# Patient Record
Sex: Male | Born: 1940
Health system: Southern US, Community
[De-identification: ages and names within clinical notes are randomized; demographics above are authoritative.]

## PROBLEM LIST (undated history)

## (undated) DIAGNOSIS — D332 Benign neoplasm of brain, unspecified: Secondary | ICD-10-CM

---

## 2007-02-25 ENCOUNTER — Emergency Department (HOSPITAL_COMMUNITY): Admission: EM | Admit: 2007-02-25 | Discharge: 2007-02-25 | Payer: Self-pay | Admitting: Emergency Medicine

## 2007-03-02 ENCOUNTER — Encounter (HOSPITAL_COMMUNITY): Admission: RE | Admit: 2007-03-02 | Discharge: 2007-05-26 | Payer: Self-pay | Admitting: Emergency Medicine

## 2007-03-05 ENCOUNTER — Emergency Department (HOSPITAL_COMMUNITY): Admission: EM | Admit: 2007-03-05 | Discharge: 2007-03-05 | Payer: Self-pay | Admitting: Emergency Medicine

## 2008-10-16 ENCOUNTER — Encounter (INDEPENDENT_AMBULATORY_CARE_PROVIDER_SITE_OTHER): Payer: Self-pay | Admitting: *Deleted

## 2013-06-07 DIAGNOSIS — Z800 Family history of malignant neoplasm of digestive organs: Secondary | ICD-10-CM | POA: Diagnosis not present

## 2013-06-07 DIAGNOSIS — Z1211 Encounter for screening for malignant neoplasm of colon: Secondary | ICD-10-CM | POA: Diagnosis not present

## 2015-02-04 ENCOUNTER — Other Ambulatory Visit: Payer: Self-pay | Admitting: Family Medicine

## 2015-02-04 ENCOUNTER — Ambulatory Visit
Admission: RE | Admit: 2015-02-04 | Discharge: 2015-02-04 | Disposition: A | Discharge status: Home / Self Care | Payer: Medicare Other | Source: Ambulatory Visit | Attending: Family Medicine | Admitting: Family Medicine

## 2015-02-04 DIAGNOSIS — M25472 Effusion, left ankle: Secondary | ICD-10-CM | POA: Diagnosis not present

## 2015-02-04 DIAGNOSIS — Z23 Encounter for immunization: Secondary | ICD-10-CM | POA: Diagnosis not present

## 2015-02-04 DIAGNOSIS — L290 Pruritus ani: Secondary | ICD-10-CM | POA: Diagnosis not present

## 2015-02-04 DIAGNOSIS — M19072 Primary osteoarthritis, left ankle and foot: Secondary | ICD-10-CM | POA: Diagnosis not present

## 2015-11-21 ENCOUNTER — Ambulatory Visit
Admission: RE | Admit: 2015-11-21 | Discharge: 2015-11-21 | Disposition: A | Discharge status: Home / Self Care | Payer: Medicare Other | Source: Ambulatory Visit | Attending: Family Medicine | Admitting: Family Medicine

## 2015-11-21 ENCOUNTER — Other Ambulatory Visit: Payer: Self-pay | Admitting: Family Medicine

## 2015-11-21 DIAGNOSIS — S298XXA Other specified injuries of thorax, initial encounter: Secondary | ICD-10-CM

## 2015-11-21 DIAGNOSIS — S2241XA Multiple fractures of ribs, right side, initial encounter for closed fracture: Secondary | ICD-10-CM | POA: Diagnosis not present

## 2015-11-21 DIAGNOSIS — S20211A Contusion of right front wall of thorax, initial encounter: Secondary | ICD-10-CM

## 2015-11-21 DIAGNOSIS — S2242XA Multiple fractures of ribs, left side, initial encounter for closed fracture: Secondary | ICD-10-CM | POA: Diagnosis not present

## 2015-11-21 DIAGNOSIS — R0781 Pleurodynia: Secondary | ICD-10-CM | POA: Diagnosis not present

## 2017-10-07 DIAGNOSIS — R42 Dizziness and giddiness: Secondary | ICD-10-CM | POA: Diagnosis not present

## 2017-10-07 DIAGNOSIS — H9201 Otalgia, right ear: Secondary | ICD-10-CM | POA: Diagnosis not present

## 2017-10-07 DIAGNOSIS — R2689 Other abnormalities of gait and mobility: Secondary | ICD-10-CM | POA: Diagnosis not present

## 2017-10-18 ENCOUNTER — Encounter (HOSPITAL_COMMUNITY): Payer: Self-pay | Admitting: Emergency Medicine

## 2017-10-18 ENCOUNTER — Emergency Department (HOSPITAL_COMMUNITY): Payer: Medicare Other

## 2017-10-18 ENCOUNTER — Emergency Department (HOSPITAL_COMMUNITY)
Admission: EM | Admit: 2017-10-18 | Discharge: 2017-10-18 | Disposition: A | Discharge status: Home / Self Care | Payer: Medicare Other | Attending: Emergency Medicine | Admitting: Emergency Medicine

## 2017-10-18 ENCOUNTER — Other Ambulatory Visit: Payer: Self-pay

## 2017-10-18 DIAGNOSIS — H829 Vertiginous syndromes in diseases classified elsewhere, unspecified ear: Secondary | ICD-10-CM | POA: Insufficient documentation

## 2017-10-18 DIAGNOSIS — R42 Dizziness and giddiness: Secondary | ICD-10-CM | POA: Diagnosis not present

## 2017-10-18 DIAGNOSIS — D333 Benign neoplasm of cranial nerves: Secondary | ICD-10-CM | POA: Diagnosis not present

## 2017-10-18 DIAGNOSIS — Z79899 Other long term (current) drug therapy: Secondary | ICD-10-CM | POA: Insufficient documentation

## 2017-10-18 DIAGNOSIS — R29810 Facial weakness: Secondary | ICD-10-CM | POA: Diagnosis not present

## 2017-10-18 LAB — DIFFERENTIAL
Basophils Absolute: 0 K/uL (ref 0.0–0.1)
Basophils Relative: 0 %
Eosinophils Absolute: 0.3 K/uL (ref 0.0–0.7)
Eosinophils Relative: 4 %
Lymphocytes Relative: 39 %
Lymphs Abs: 2.4 K/uL (ref 0.7–4.0)
Monocytes Absolute: 0.6 K/uL (ref 0.1–1.0)
Monocytes Relative: 10 %
Neutro Abs: 3 K/uL (ref 1.7–7.7)
Neutrophils Relative %: 47 %

## 2017-10-18 LAB — I-STAT CHEM 8, ED
BUN: 23 mg/dL — AB (ref 6–20)
CHLORIDE: 101 mmol/L (ref 101–111)
CREATININE: 0.9 mg/dL (ref 0.61–1.24)
Calcium, Ion: 1.2 mmol/L (ref 1.15–1.40)
Glucose, Bld: 94 mg/dL (ref 65–99)
HCT: 42 % (ref 39.0–52.0)
Hemoglobin: 14.3 g/dL (ref 13.0–17.0)
POTASSIUM: 3.7 mmol/L (ref 3.5–5.1)
Sodium: 140 mmol/L (ref 135–145)
TCO2: 26 mmol/L (ref 22–32)

## 2017-10-18 LAB — COMPREHENSIVE METABOLIC PANEL WITH GFR
ALT: 24 U/L (ref 17–63)
AST: 28 U/L (ref 15–41)
Albumin: 4.3 g/dL (ref 3.5–5.0)
Alkaline Phosphatase: 46 U/L (ref 38–126)
Anion gap: 11 (ref 5–15)
BUN: 27 mg/dL — ABNORMAL HIGH (ref 6–20)
CO2: 25 mmol/L (ref 22–32)
Calcium: 9.5 mg/dL (ref 8.9–10.3)
Chloride: 104 mmol/L (ref 101–111)
Creatinine, Ser: 0.92 mg/dL (ref 0.61–1.24)
GFR calc Af Amer: >60 mL/min (ref >60)
GFR calc non Af Amer: >60 mL/min (ref >60)
Glucose, Bld: 95 mg/dL (ref 65–99)
Potassium: 3.9 mmol/L (ref 3.5–5.1)
Sodium: 140 mmol/L (ref 135–145)
Total Bilirubin: 1 mg/dL (ref 0.3–1.2)
Total Protein: 7.8 g/dL (ref 6.5–8.1)

## 2017-10-18 LAB — CBC
HEMATOCRIT: 43.5 % (ref 39.0–52.0)
Hemoglobin: 15.1 g/dL (ref 13.0–17.0)
MCH: 32.7 pg (ref 26.0–34.0)
MCHC: 34.7 g/dL (ref 30.0–36.0)
MCV: 94.2 fL (ref 78.0–100.0)
PLATELETS: 132 10*3/uL — AB (ref 150–400)
RBC: 4.62 MIL/uL (ref 4.22–5.81)
RDW: 12.8 % (ref 11.5–15.5)
WBC: 6.3 10*3/uL (ref 4.0–10.5)

## 2017-10-18 LAB — URINALYSIS, ROUTINE W REFLEX MICROSCOPIC
BILIRUBIN URINE: NEGATIVE
Glucose, UA: NEGATIVE mg/dL
HGB URINE DIPSTICK: NEGATIVE
KETONES UR: NEGATIVE mg/dL
Leukocytes, UA: NEGATIVE
NITRITE: NEGATIVE
PROTEIN: NEGATIVE mg/dL
Specific Gravity, Urine: 1.006 (ref 1.005–1.030)
pH: 6 (ref 5.0–8.0)

## 2017-10-18 LAB — RAPID URINE DRUG SCREEN, HOSP PERFORMED
Amphetamines: NOT DETECTED
Barbiturates: NOT DETECTED
Benzodiazepines: NOT DETECTED
Cocaine: NOT DETECTED
Opiates: NOT DETECTED
Tetrahydrocannabinol: NOT DETECTED

## 2017-10-18 LAB — PROTIME-INR
INR: 1.05
Prothrombin Time: 13.6 seconds (ref 11.4–15.2)

## 2017-10-18 LAB — I-STAT TROPONIN, ED: Troponin i, poc: 0.01 ng/mL (ref 0.00–0.08)

## 2017-10-18 LAB — ETHANOL

## 2017-10-18 LAB — APTT: aPTT: 26 s (ref 24–36)

## 2017-10-18 MED ORDER — METHYLPREDNISOLONE 4 MG PO TBPK: ORAL_TABLET | ORAL | 0 refills | Status: DC

## 2017-10-18 MED ORDER — ARTIFICIAL TEARS OPHTHALMIC OINT: TOPICAL_OINTMENT | OPHTHALMIC | 1 refills | Status: DC | PRN

## 2017-10-18 MED ORDER — ARTIFICIAL TEARS OPHTHALMIC OINT
TOPICAL_OINTMENT | OPHTHALMIC | Status: DC | PRN
  Administered 2017-10-18: 15:00:00 via OPHTHALMIC
  Filled 2017-10-18: qty 3.5

## 2017-10-18 MED ORDER — ARTIFICIAL TEARS OPHTHALMIC OINT: TOPICAL_OINTMENT | Freq: Once | OPHTHALMIC | Status: DC

## 2017-10-18 NOTE — ED Notes (Signed)
Patient transported to MR. 

## 2017-10-18 NOTE — ED Notes (Signed)
ED Provider at bedside. 

## 2017-10-18 NOTE — ED Triage Notes (Signed)
Patient experiencing right sided facial droop since this morning. Has had equilibrium problems for 1 month and was prescribed meclizine. MD has seen patient. VS stable.

## 2017-10-18 NOTE — ED Notes (Signed)
Patient in MR.

## 2017-10-18 NOTE — ED Provider Notes (Signed)
[facility] DEPT Provider Note  CSN: [id number] Arrival date & time: 10/18/17 0957  Chief Complaint(s) Facial Droop  HPI Walter Parrish is a 77 y.o. male with no pertinent past medical history who presents to the emergency department with 2 days of gradually worsening right facial droop.  Patient reports that he noted difficulty closing his eyes yesterday morning.  Noticed that he had difficulty with speech today.  Denies any other focal deficits.  Endorses several weeks of vertiginous symptoms diagnosed with peripheral vertigo by his PCP and given meclizine which is not helping.  He denies any recent fevers or infections.  Denies any headache, visual disturbance.  No chest pain or shortness of breath.  No nausea or vomiting.  No abdominal pain.  HPI  Past Medical History History reviewed. No pertinent past medical history. There are no active problems to display for this patient.  Home Medication(s) Prior to Admission medications   Medication Sig Start Date End Date Taking? Authorizing Provider  Cyanocobalamin (VITAMIN B 12 PO) Take 1 tablet by mouth daily.   Yes [provider]  meclizine (ANTIVERT) 25 MG tablet Take 25 mg by mouth daily.   Yes [provider]  artificial tears (LACRILUBE) OINT ophthalmic ointment Place into the right eye every 4 (four) hours as needed for dry eyes. 10/18/17   Fatima Blank, MD  methylPREDNISolone (MEDROL DOSEPAK) 4 MG TBPK tablet Use as directed on the package 10/18/17   Braeson Rupe, Grayce Sessions, MD                                                                                                                                    Past Surgical History History reviewed. No pertinent surgical history. Family History History reviewed. No pertinent family history.  Social History Social History   Tobacco Use  . Smoking status: Not on file  Substance Use Topics  . Alcohol use: Not on file  . Drug  use: Not on file   Allergies Patient has no known allergies.  Review of Systems Review of Systems All other systems are reviewed and are negative for acute change except as noted in the HPI  Physical Exam Vital Signs  I have reviewed the triage vital signs BP (!) 166/99   Pulse 67   Temp 98.2 F (36.8 C) (Oral)   Resp (!) 24   SpO2 100%   Physical Exam  Constitutional: He is oriented to person, place, and time. He appears well-developed and well-nourished. No distress.  HENT:  Head: Normocephalic and atraumatic.  Nose: Nose normal.  Eyes: Pupils are equal, round, and reactive to light. Conjunctivae and EOM are normal. Right eye exhibits no discharge. Left eye exhibits no discharge. No scleral icterus.  Neck: Normal range of motion. Neck supple.  Cardiovascular: Normal rate and regular rhythm. Exam reveals no gallop and no friction rub.  No murmur heard. Pulmonary/Chest: Effort normal and breath  sounds normal. No stridor. No respiratory distress. He has no rales.  Abdominal: Soft. He exhibits no distension. There is no tenderness.  Musculoskeletal: He exhibits no edema or tenderness.  Neurological: He is alert and oriented to person, place, and time.  Mental Status:  Alert and oriented to person, place, and time.  Attention and concentration normal.  Speech clear.  Recent memory is intact  Cranial Nerves:  II Visual Fields: Intact to confrontation. Visual fields intact. III, IV, VI: Pupils equal and reactive to light and near. Full eye movement with unilateral nystagmus  V Facial Sensation: Normal. No weakness of masticatory muscles  VII: Right facial droop involving the forehead VIII:  Auditory Acuity: Grossly normal  IX/X: The uvula is midline; the palate elevates symmetrically  XI: Normal sternocleidomastoid and trapezius strength  XII: The tongue is midline. No atrophy or fasciculations.   Motor System: Muscle Strength: 5/5 and symmetric in the upper and lower  extremities. No pronation or drift.  Muscle Tone: Tone and muscle bulk are normal in the upper and lower extremities.   Reflexes: DTRs: 1+ and symmetrical in all four extremities. No Clonus Coordination: Intact finger-to-nose, heel-to-shin. No tremor.  Sensation: Intact to light touch, and pinprick. Positive Romberg test.  Gait: Routine gait normal; ataxic tandem gait.  HINTS Plus: Nystagmus: Unilateral nystagmus with leftward fast beat on left gaze Head impulse: Abnormal with head impulse towards the left Skew: Abnormal left eye skew Hearing: mildly decreased on right     Skin: Skin is warm and dry. No rash noted. He is not diaphoretic. No erythema.  Psychiatric: He has a normal mood and affect.  Vitals reviewed.   ED Results and Treatments Labs (all labs ordered are listed, but only abnormal results are displayed) Labs Reviewed  CBC - Abnormal; Notable for the following components:      Result Value   Platelets 132 (*)    All other components within normal limits  COMPREHENSIVE METABOLIC PANEL - Abnormal; Notable for the following components:   BUN 27 (*)    All other components within normal limits  URINALYSIS, ROUTINE W REFLEX MICROSCOPIC - Abnormal; Notable for the following components:   Color, Urine STRAW (*)    All other components within normal limits  I-STAT CHEM 8, ED - Abnormal; Notable for the following components:   BUN 23 (*)    All other components within normal limits  ETHANOL  PROTIME-INR  APTT  DIFFERENTIAL  RAPID URINE DRUG SCREEN, HOSP PERFORMED  I-STAT TROPONIN, ED                                                                                                                         EKG  EKG Interpretation  Date/Time:  Tuesday Oct 18 2017 10:52:00 EDT Ventricular Rate:  57 PR Interval:    QRS Duration: 123 QT Interval:  490 QTC Calculation: 478 R Axis:   51 Text Interpretation:  Sinus rhythm Right bundle branch block NO STEMI  No old tracing  to compare Confirmed by Addison Lank [phone]) on 10/18/2017 11:40:05 AM      Radiology Ct Head Wo Contrast  Result Date: 10/18/2017 CLINICAL DATA:  Right facial droop beginning this morning. EXAM: CT HEAD WITHOUT CONTRAST TECHNIQUE: Contiguous axial images were obtained from the base of the skull through the vertex without intravenous contrast. COMPARISON:  None. FINDINGS: Brain: No evidence of acute infarction, hemorrhage, hydrocephalus, extra-axial collection or mass lesion/mass effect. Vascular: No hyperdense vessel or unexpected calcification. Skull: Intact Sinuses/Orbits: Mucosal thickening in the sphenoid sinuses is worse on the left. Mild, scattered ethmoid air cell disease also noted. Other: None. IMPRESSION: No acute abnormality. Left worse than right sphenoid sinus mucosal thickening. Mild scattered ethmoid air cell disease also noted. Electronically Signed   By: Inge Rise M.D.   On: 10/18/2017 11:40   Mr Brain Wo Contrast  Result Date: 10/18/2017 CLINICAL DATA:  Right-sided facial droop since this morning. Equilibrium problems for 1 month EXAM: MRI HEAD WITHOUT CONTRAST TECHNIQUE: Multiplanar, multiecho pulse sequences of the brain and surrounding structures were obtained without intravenous contrast. COMPARISON:  Head CT from earlier today FINDINGS: Brain: Mass in the right internal auditory canal measuring 7 x 4 mm, usually a schwannoma, parent nerve uncertain with this presentation. No history of malignancy per the chart. No infarct, hemorrhage, hydrocephalus, or collection. There is mild generalized cerebral volume loss and mild presumed chronic small vessel ischemic change in the cerebral white matter. Vascular: Major flow voids are preserved. Skull and upper cervical spine: No evidence of marrow lesion Sinuses/Orbits: Mild patchy mucosal thickening in the paranasal sinuses. IMPRESSION: 7 x 4 mm mass at the right internal auditory canal, usually a schwannoma. Recommend postcontrast  brain MR with IAC protocol. Electronically Signed   By: Monte Fantasia M.D.   On: 10/18/2017 13:04   Pertinent labs & imaging results that were available during my care of the patient were reviewed by me and considered in my medical decision making (see chart for details).  Medications Ordered in ED Medications  artificial tears (LACRILUBE) ophthalmic ointment (has no administration in time range)                                                                                                                                    Procedures Procedures  (including critical care time)  Medical Decision Making / ED Course I have reviewed the nursing notes for this encounter and the patient's prior records (if available in EHR or on provided paperwork).    Presentation is consistent with Bell's palsy.  Doubt CVA.  Given inconsistent HINTS exam concerning for central process, stroke work-up was obtained and revealed a right acoustic canal schwannoma.  Rest of the work-up was reassuring.  Case discussed with Dr. Christella Noa from neurosurgery.  Will follow up with the patient in clinic.  Patient provided with artificial tears and Medrol Dosepak for inflammation.  Recommended no  driving until evaluated and cleared by neurosurgery.  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 Discharge instructions discussed at great length. The patient was given strict return precautions who verbalized understanding of the instructions. No further questions at time of discharge.    ED Discharge Orders        Ordered    methylPREDNISolone (MEDROL DOSEPAK) 4 MG TBPK tablet     10/18/17 1450    artificial tears (LACRILUBE) OINT ophthalmic ointment  Every 4 hours PRN      10/18/17 1450       Follow Up: [place] [phone] Follow up This office can become your primary doctor.  Ashok Pall, MD [address] [place] [address] [phone]  Call  For close follow up to assess for schannoma of the right ear     This chart was dictated using voice recognition software.  Despite best efforts to proofread,  errors can occur which can change the documentation meaning.   Fatima Blank, MD 10/18/17 [phone]

## 2017-10-18 NOTE — ED Notes (Signed)
MR at bedside.

## 2017-10-18 NOTE — ED Notes (Signed)
Patient aware we need a urine sample. Urinal at bedside. °

## 2017-10-18 NOTE — ED Notes (Signed)
Patient transported to radiology

## 2017-10-18 NOTE — ED Notes (Signed)
Patient aware he is going to MR. Patient states he is not claustrophobic.

## 2017-10-25 DIAGNOSIS — G510 Bell's palsy: Secondary | ICD-10-CM | POA: Diagnosis not present

## 2017-10-25 DIAGNOSIS — I10 Essential (primary) hypertension: Secondary | ICD-10-CM | POA: Diagnosis not present

## 2017-10-25 DIAGNOSIS — D333 Benign neoplasm of cranial nerves: Secondary | ICD-10-CM | POA: Diagnosis not present

## 2017-11-08 DIAGNOSIS — D333 Benign neoplasm of cranial nerves: Secondary | ICD-10-CM | POA: Diagnosis not present

## 2017-11-08 DIAGNOSIS — H9191 Unspecified hearing loss, right ear: Secondary | ICD-10-CM | POA: Diagnosis not present

## 2017-11-11 DIAGNOSIS — H9311 Tinnitus, right ear: Secondary | ICD-10-CM | POA: Diagnosis not present

## 2017-11-11 DIAGNOSIS — H838X3 Other specified diseases of inner ear, bilateral: Secondary | ICD-10-CM | POA: Diagnosis not present

## 2017-11-11 DIAGNOSIS — H9041 Sensorineural hearing loss, unilateral, right ear, with unrestricted hearing on the contralateral side: Secondary | ICD-10-CM | POA: Diagnosis not present

## 2017-11-11 DIAGNOSIS — D333 Benign neoplasm of cranial nerves: Secondary | ICD-10-CM | POA: Diagnosis not present

## 2017-11-11 DIAGNOSIS — R42 Dizziness and giddiness: Secondary | ICD-10-CM | POA: Diagnosis not present

## 2017-12-06 DIAGNOSIS — D333 Benign neoplasm of cranial nerves: Secondary | ICD-10-CM | POA: Diagnosis not present

## 2017-12-06 DIAGNOSIS — H8120 Vestibular neuronitis, unspecified ear: Secondary | ICD-10-CM | POA: Diagnosis not present

## 2017-12-06 DIAGNOSIS — G510 Bell's palsy: Secondary | ICD-10-CM | POA: Diagnosis not present

## 2017-12-06 DIAGNOSIS — H9190 Unspecified hearing loss, unspecified ear: Secondary | ICD-10-CM | POA: Diagnosis not present

## 2017-12-06 DIAGNOSIS — G9389 Other specified disorders of brain: Secondary | ICD-10-CM | POA: Diagnosis not present

## 2018-01-24 DIAGNOSIS — Z7189 Other specified counseling: Secondary | ICD-10-CM | POA: Diagnosis not present

## 2018-01-24 DIAGNOSIS — Z298 Encounter for other specified prophylactic measures: Secondary | ICD-10-CM | POA: Diagnosis not present

## 2018-05-01 DIAGNOSIS — D333 Benign neoplasm of cranial nerves: Secondary | ICD-10-CM | POA: Diagnosis not present

## 2018-05-01 DIAGNOSIS — Z510 Encounter for antineoplastic radiation therapy: Secondary | ICD-10-CM | POA: Diagnosis not present

## 2018-05-02 DIAGNOSIS — D333 Benign neoplasm of cranial nerves: Secondary | ICD-10-CM | POA: Diagnosis not present

## 2018-05-02 DIAGNOSIS — Z510 Encounter for antineoplastic radiation therapy: Secondary | ICD-10-CM | POA: Diagnosis not present

## 2018-05-03 DIAGNOSIS — Z510 Encounter for antineoplastic radiation therapy: Secondary | ICD-10-CM | POA: Diagnosis not present

## 2018-05-03 DIAGNOSIS — D333 Benign neoplasm of cranial nerves: Secondary | ICD-10-CM | POA: Diagnosis not present

## 2018-05-04 DIAGNOSIS — Z510 Encounter for antineoplastic radiation therapy: Secondary | ICD-10-CM | POA: Diagnosis not present

## 2018-05-04 DIAGNOSIS — D333 Benign neoplasm of cranial nerves: Secondary | ICD-10-CM | POA: Diagnosis not present

## 2018-05-05 DIAGNOSIS — Z510 Encounter for antineoplastic radiation therapy: Secondary | ICD-10-CM | POA: Diagnosis not present

## 2018-05-05 DIAGNOSIS — D333 Benign neoplasm of cranial nerves: Secondary | ICD-10-CM | POA: Diagnosis not present

## 2018-09-07 IMAGING — MR MR HEAD W/O CM
8 of 10 series · 37 of 48 positions shown · non-contrast
Comparison: Head CT from earlier today

CLINICAL DATA: Right-sided facial droop since this morning.
Equilibrium problems for 1 month

EXAM:
MRI HEAD WITHOUT CONTRAST
TECHNIQUE: Multiplanar, multiecho pulse sequences of the brain and surrounding
structures were obtained without intravenous contrast.

[Series 3: DWI · axial · 3.0mm · 1.09mm/px · z∈[-70,+82]mm · 8 of 104 slices shown (1 of 4)]
[im 1/104]
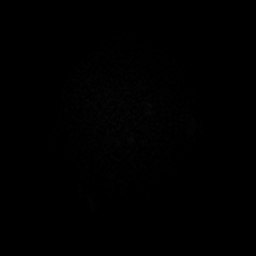
[im 12/104]
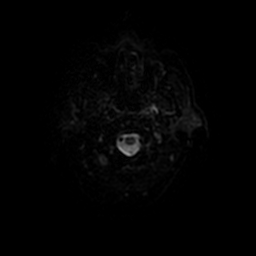
[im 35/104]
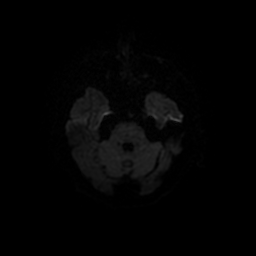
[im 46/104]
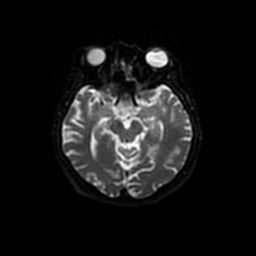
[im 58/104]
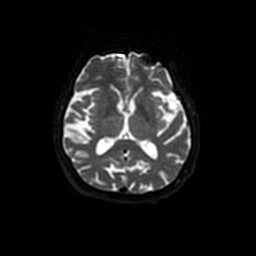
[im 69/104]
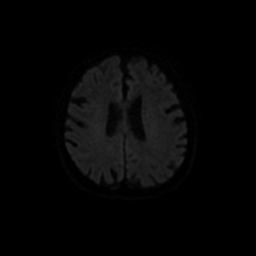
[im 92/104]
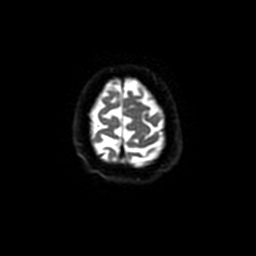
[im 104/104]
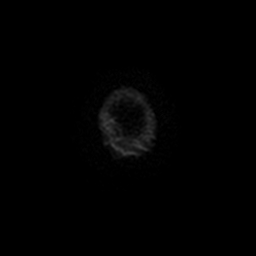

[Series 4: T1 · sagittal · 5.0mm · 0.47mm/px · 2 of 24 slices shown]
[im 1/24]
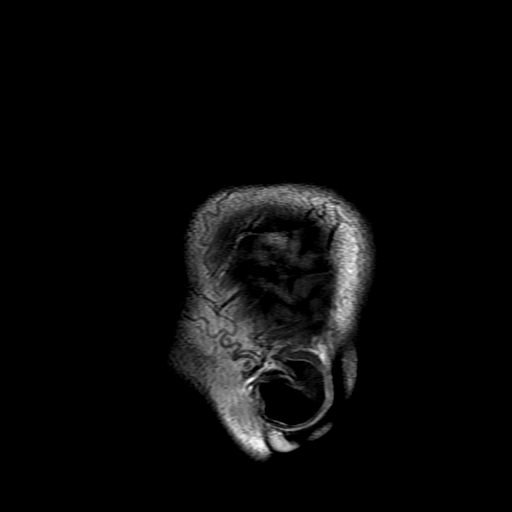
[im 24/24]
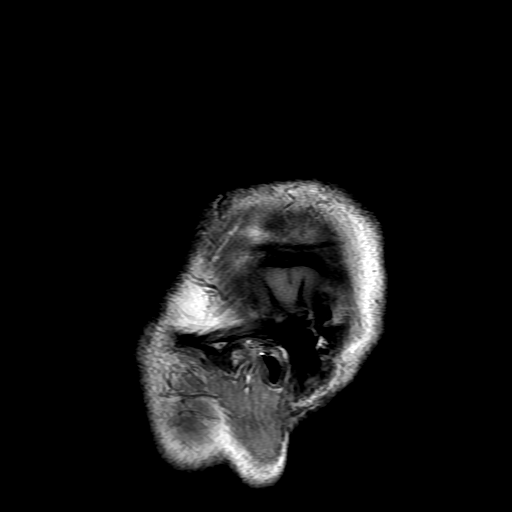

[Series 5: DWI · coronal · 5.0mm · 1.09mm/px · 8 of 76 slices shown (2 of 4)]
[im 1/76]
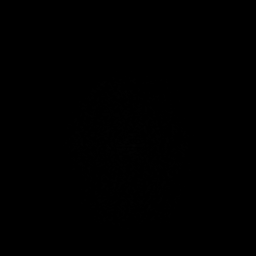
[im 11/76]
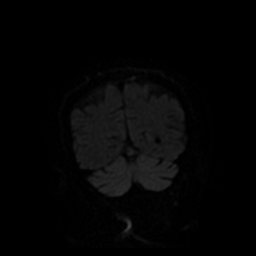
[im 22/76]
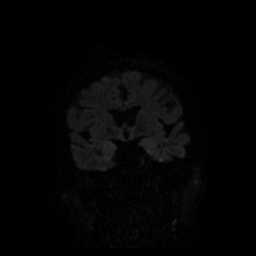
[im 33/76]
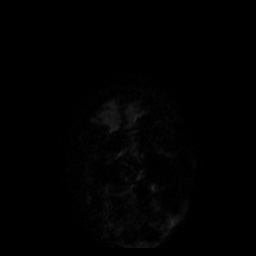
[im 43/76]
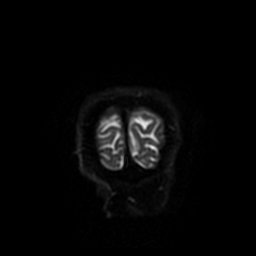
[im 54/76]
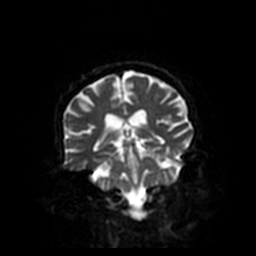
[im 65/76]
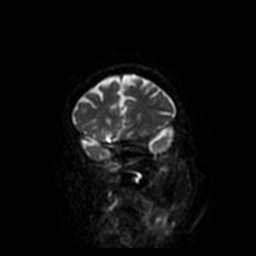
[im 76/76]
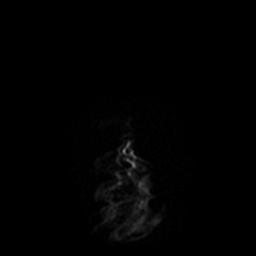

[Series 6: T2 · axial · 5.0mm · 0.43mm/px · z∈[-73,+81]mm · 3 of 24 slices shown (1 of 2)]
[im 1/24]
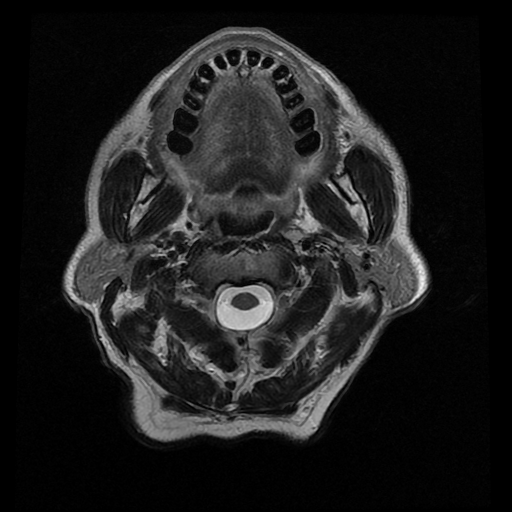
[im 12/24]
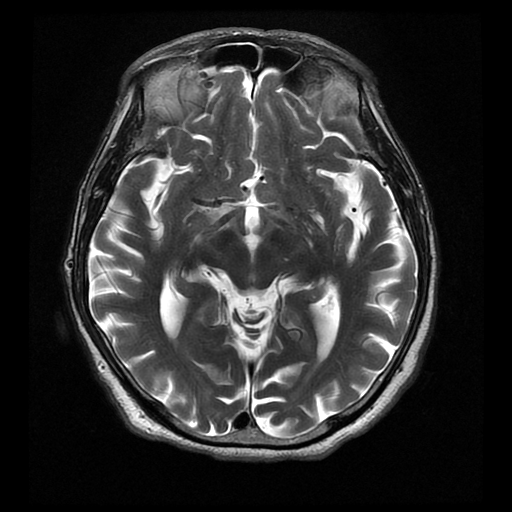
[im 24/24]
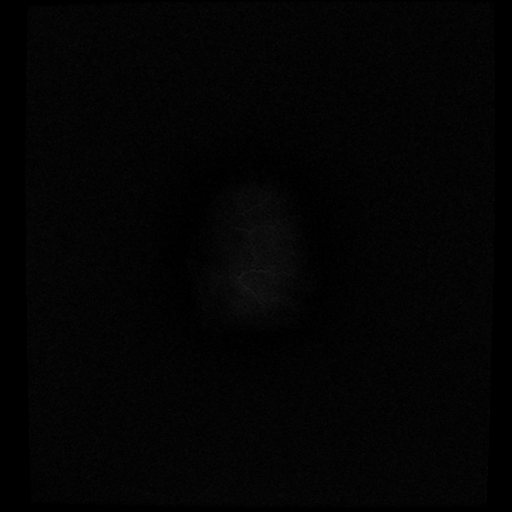

[Series 7: FLAIR · axial · 3.0mm · 0.43mm/px · z∈[-77,+85]mm · 3 of 29 slices shown]
[im 1/29]
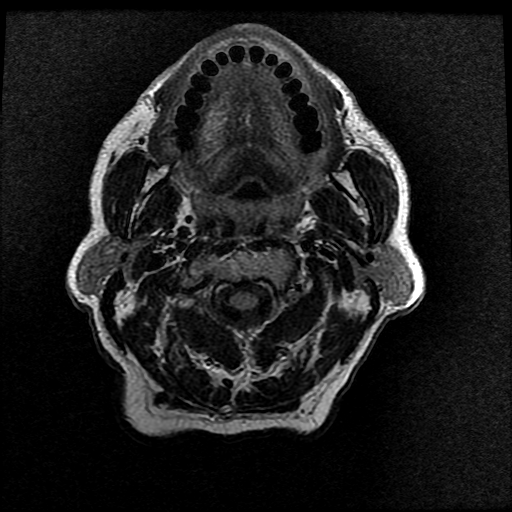
[im 15/29]
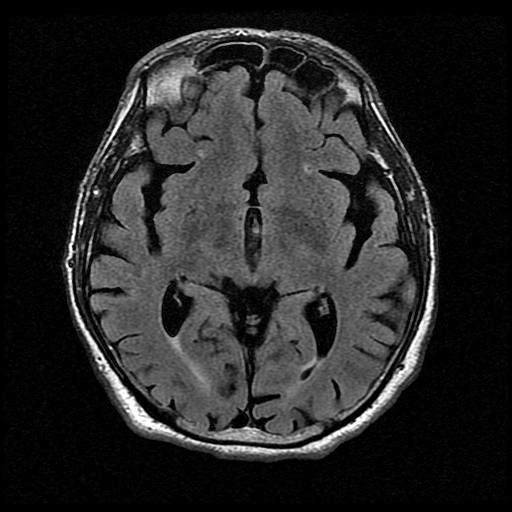
[im 29/29]
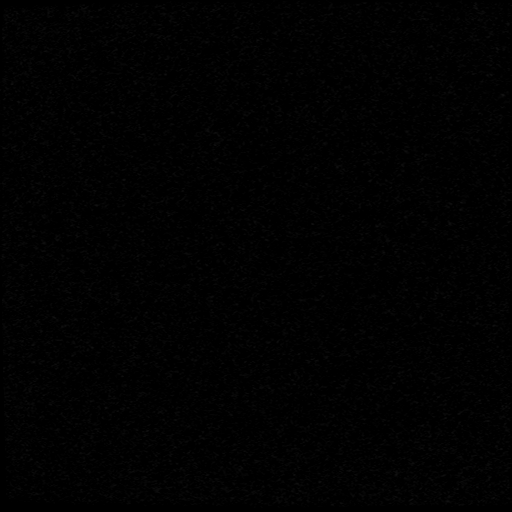

[Series 10: T2 · coronal · 5.0mm · 0.45mm/px · 3 of 27 slices shown (2 of 2)]
[im 1/27]
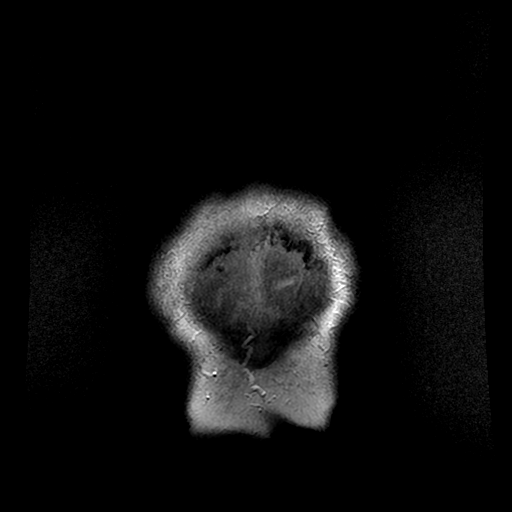
[im 14/27]
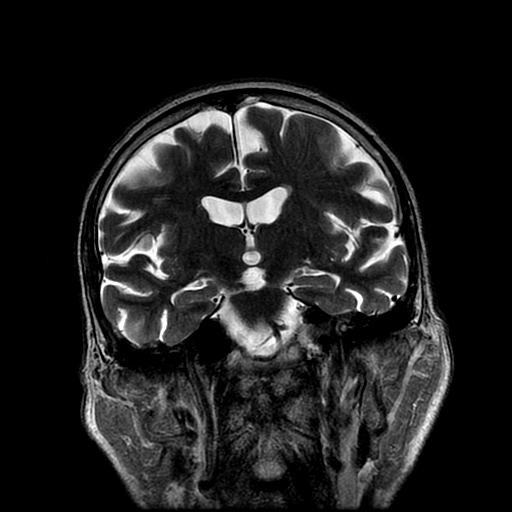
[im 27/27]
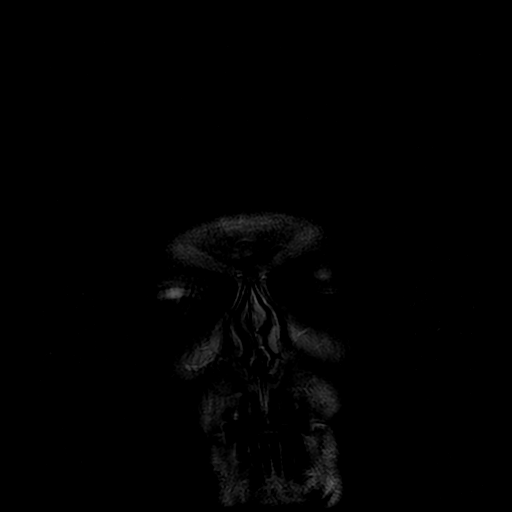

[Series 300: DWI · axial · 3.0mm · 1.09mm/px · z∈[-70,+82]mm · 6 of 52 slices shown (3 of 4)]
[im 1/52]
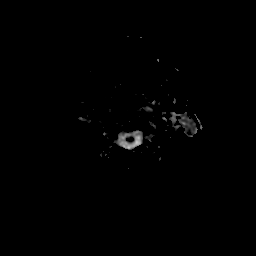
[im 11/52]
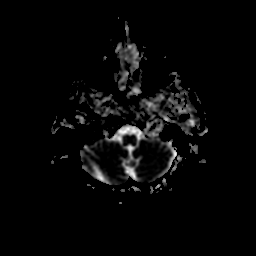
[im 21/52]
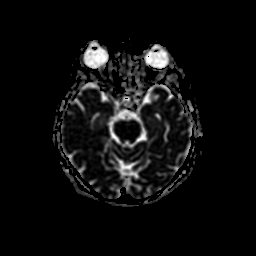
[im 31/52]
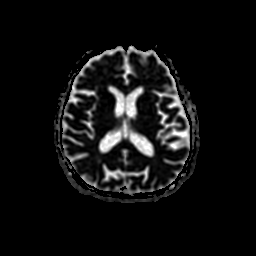
[im 41/52]
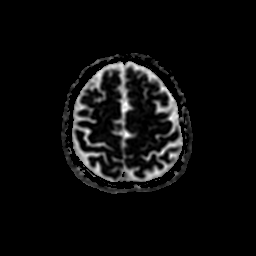
[im 52/52]
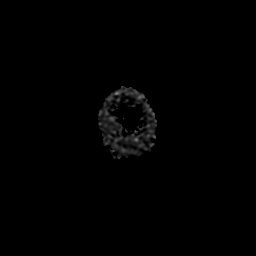

[Series 500: DWI · coronal · 5.0mm · 1.09mm/px · 4 of 37 slices shown (4 of 4)]
[im 1/37]
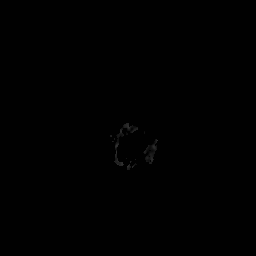
[im 13/37]
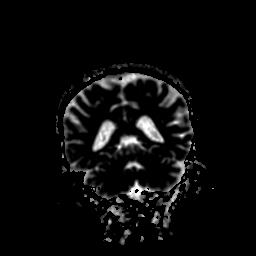
[im 25/37]
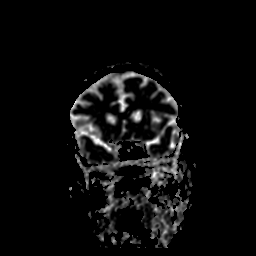
[im 37/37]
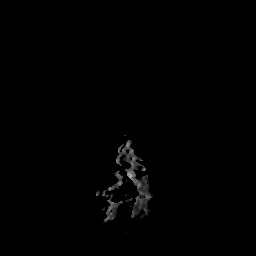

[37 of 48 positions shown; findings below may reference images not displayed]

FINDINGS: Brain: Mass in the right internal auditory canal measuring 7 x 4 mm,
usually a schwannoma, parent nerve uncertain with this presentation.
No history of malignancy per the chart.

No infarct, hemorrhage, hydrocephalus, or collection. There is mild
generalized cerebral volume loss and mild presumed chronic small
vessel ischemic change in the cerebral white matter.

Vascular: Major flow voids are preserved.

Skull and upper cervical spine: No evidence of marrow lesion

Sinuses/Orbits: Mild patchy mucosal thickening in the paranasal
sinuses.
IMPRESSION: 7 x 4 mm mass at the right internal auditory canal, usually a
schwannoma. Recommend postcontrast brain MR with IAC protocol.

## 2019-04-14 ENCOUNTER — Encounter (HOSPITAL_COMMUNITY): Payer: Self-pay | Admitting: Physician Assistant

## 2019-04-14 ENCOUNTER — Emergency Department (HOSPITAL_COMMUNITY)
Admission: EM | Admit: 2019-04-14 | Discharge: 2019-04-14 | Disposition: A | Discharge status: Home / Self Care | Payer: Medicare Other | Attending: Emergency Medicine | Admitting: Emergency Medicine

## 2019-04-14 ENCOUNTER — Other Ambulatory Visit: Payer: Self-pay

## 2019-04-14 DIAGNOSIS — Z23 Encounter for immunization: Secondary | ICD-10-CM | POA: Diagnosis not present

## 2019-04-14 DIAGNOSIS — S61212A Laceration without foreign body of right middle finger without damage to nail, initial encounter: Secondary | ICD-10-CM | POA: Diagnosis not present

## 2019-04-14 DIAGNOSIS — Z79899 Other long term (current) drug therapy: Secondary | ICD-10-CM | POA: Diagnosis not present

## 2019-04-14 DIAGNOSIS — Y9301 Activity, walking, marching and hiking: Secondary | ICD-10-CM | POA: Diagnosis not present

## 2019-04-14 DIAGNOSIS — Y929 Unspecified place or not applicable: Secondary | ICD-10-CM | POA: Diagnosis not present

## 2019-04-14 DIAGNOSIS — Y999 Unspecified external cause status: Secondary | ICD-10-CM | POA: Insufficient documentation

## 2019-04-14 DIAGNOSIS — W1830XA Fall on same level, unspecified, initial encounter: Secondary | ICD-10-CM | POA: Insufficient documentation

## 2019-04-14 DIAGNOSIS — W19XXXA Unspecified fall, initial encounter: Secondary | ICD-10-CM

## 2019-04-14 HISTORY — DX: Benign neoplasm of brain, unspecified: D33.2

## 2019-04-14 MED ORDER — CEPHALEXIN 500 MG PO CAPS: 500.0000 mg | ORAL_CAPSULE | Freq: Four times a day (QID) | ORAL | 0 refills | Status: DC

## 2019-04-14 MED ORDER — LIDOCAINE HCL (PF) 1 % IJ SOLN
5.0000 mL | Freq: Once | INTRAMUSCULAR | Status: AC
  Administered 2019-04-14: 5 mL
  Filled 2019-04-14: qty 5

## 2019-04-14 MED ORDER — TETANUS-DIPHTH-ACELL PERTUSSIS 5-2.5-18.5 LF-MCG/0.5 IM SUSP
0.5000 mL | Freq: Once | INTRAMUSCULAR | Status: AC
  Administered 2019-04-14: 0.5 mL via INTRAMUSCULAR
  Filled 2019-04-14: qty 0.5

## 2019-04-14 MED ORDER — BUPIVACAINE HCL (PF) 0.5 % IJ SOLN
5.0000 mL | Freq: Once | INTRAMUSCULAR | Status: AC
  Administered 2019-04-14: 19:00:00 5 mL
  Filled 2019-04-14: qty 10

## 2019-04-14 MED ORDER — CEPHALEXIN 250 MG PO CAPS
500.0000 mg | ORAL_CAPSULE | Freq: Once | ORAL | Status: AC
  Administered 2019-04-14: 20:00:00 500 mg via ORAL
  Filled 2019-04-14: qty 2

## 2019-04-14 NOTE — ED Triage Notes (Signed)
Pt fell while hiking cutting his right ring finger. Denies loc or hitting head.

## 2019-04-14 NOTE — Discharge Instructions (Signed)

## 2019-04-14 NOTE — ED Notes (Signed)
Called xray to let them know pt is now willing to have xray.

## 2019-04-14 NOTE — ED Notes (Signed)
Patient verbalizes understanding of discharge instructions. Opportunity for questioning and answers were provided. Armband removed by staff, pt discharged from ED.  

## 2019-04-14 NOTE — ED Provider Notes (Signed)
[facility] EMERGENCY DEPARTMENT Provider Note   CSN: [id number] Arrival date & time: 04/14/19  1642     History   Chief Complaint Chief Complaint  Patient presents with   Laceration    right ring finger    HPI Walter Parrish is a 78 y.o. male with no significant past medical history who presents today for evaluation after a fall.  He states that shortly prior to arrival he was hiking outside when he had a mechanical, nonsyncopal fall.  He states that he caught his right ring finger on the ground.  He is unsure when his last tetanus shot was.  He denies striking his home.  He denies any injuries other than his right ring finger.    HPI  Past Medical History:  Diagnosis Date   Benign brain tumor [facility])    Radation Dec 2019    There are no active problems to display for this patient.   History reviewed. No pertinent surgical history.      Home Medications    Prior to Admission medications   Medication Sig Start Date End Date Taking? Authorizing Provider  artificial tears (LACRILUBE) OINT ophthalmic ointment Place into the right eye every 4 (four) hours as needed for dry eyes. 10/18/17   Cardama, Grayce Sessions, MD  cephALEXin (KEFLEX) 500 MG capsule Take 1 capsule (500 mg total) by mouth 4 (four) times daily. 04/14/19   Lorin Glass, PA-C  Cyanocobalamin (VITAMIN B 12 PO) Take 1 tablet by mouth daily.    [provider]  meclizine (ANTIVERT) 25 MG tablet Take 25 mg by mouth daily.    [provider]  methylPREDNISolone (MEDROL DOSEPAK) 4 MG TBPK tablet Use as directed on the package 10/18/17   Cardama, Grayce Sessions, MD    Family History History reviewed. No pertinent family history.  Social History Social History   Tobacco Use   Smoking status: Never Smoker   Smokeless tobacco: Never Used  Substance Use Topics   Alcohol use: Yes    Alcohol/week: 1.0 standard drinks    Types: 1 Glasses of wine per week    Comment:  daily   Drug use: Never     Allergies   Patient has no known allergies.   Review of Systems Review of Systems  Constitutional: Negative for chills and fever.  Skin: Positive for wound.  Neurological: Negative for weakness, numbness and headaches.  All other systems reviewed and are negative.    Physical Exam Updated Vital Signs BP 110/81    Pulse 60    Ht 5\' 6"  (1.676 m)    Wt 65.8 kg    SpO2 100%    BMI 23.40 kg/m   Physical Exam Vitals signs and nursing note reviewed.  Constitutional:      General: He is not in acute distress. HENT:     Head: Normocephalic and atraumatic.  Neck:     Musculoskeletal: Normal range of motion and neck supple.  Musculoskeletal:     Comments: Full, pain free AROM of right hand and fingers.  No creptitis or deformity palpated RUE.   Skin:    Capillary Refill: Capillary refill takes less than 2 seconds.     Comments: 2cm V shaped laceration present over the right ring finger over the proximal phalange. No active bleeding.  Dirt present in the wound.   Neurological:     Mental Status: He is alert and oriented to person, place, and time.     Sensory:  No sensory deficit (Sensation intact to fingers on right hand to light touch).      ED Treatments / Results  Labs (all labs ordered are listed, but only abnormal results are displayed) Labs Reviewed - No data to display  EKG None  Radiology No results found.  Procedures .Marland KitchenLaceration Repair  Date/Time: 04/14/2019 10:49 PM Performed by: Lorin Glass, PA-C Authorized by: Lorin Glass, PA-C   Consent:    Consent obtained:  Verbal   Consent given by:  Patient   Risks discussed:  Infection, need for additional repair, poor cosmetic result, pain, retained foreign body, tendon damage, vascular damage, poor wound healing and nerve damage (Refused x-ray: increased risk of open fracture that may get infected, long term pain or abnormalities. )   Alternatives discussed:  No  treatment and referral (Alternative wound closures) Anesthesia (see MAR for exact dosages):    Anesthesia method:  Nerve block   Block location:  Right ring finger   Block needle gauge:  25 G   Block anesthetic: 50/50 mix lidocaine 1% and bupivacaine 0.5% both with out epi.    Block injection procedure:  Anatomic landmarks identified, anatomic landmarks palpated, introduced needle, negative aspiration for blood and incremental injection   Block outcome:  Anesthesia achieved Laceration details:    Location:  Finger   Finger location:  R ring finger   Length (cm):  2 Repair type:    Repair type:  Intermediate Pre-procedure details:    Preparation:  Patient was prepped and draped in usual sterile fashion (Patient refused x-ray) Exploration:    Hemostasis achieved with:  Direct pressure   Wound exploration: wound explored through full range of motion and entire depth of wound probed and visualized     Wound extent: foreign bodies/material     Wound extent: no tendon damage noted     Foreign bodies/material:  Dirt   Contaminated: yes   Treatment:    Area cleansed with:  Shur-Clens and Betadine   Amount of cleaning:  Extensive   Irrigation method:  Pressure wash   Visualized foreign bodies/material removed: yes   Skin repair:    Repair method:  Sutures   Suture size:  5-0   Wound skin closure material used: Vycryl rapide per patient request.   Suture technique:  Simple interrupted   Number of sutures:  3 Approximation:    Approximation:  Close Post-procedure details:    Dressing:  Non-adherent dressing, sterile dressing and bulky dressing   Patient tolerance of procedure:  Tolerated well, no immediate complications   (including critical care time)  Medications Ordered in ED Medications  lidocaine (PF) (XYLOCAINE) 1 % injection 5 mL (5 mLs Infiltration Given by Other 04/14/19 1900)  bupivacaine (MARCAINE) 0.5 % injection 5 mL (5 mLs Infiltration Given by Other 04/14/19 1900)    Tdap (BOOSTRIX) injection 0.5 mL (0.5 mLs Intramuscular Given 04/14/19 1859)  cephALEXin (KEFLEX) capsule 500 mg (500 mg Oral Given 04/14/19 1956)     Initial Impression / Assessment and Plan / ED Course  I have reviewed the triage vital signs and the nursing notes.  Pertinent labs & imaging results that were available during my care of the patient were reviewed by me and considered in my medical decision making (see chart for details).       Patient presents today for evaluation of a laceration of his right ring finger after a fall.  He denies any other injuries.  Discussed the indications for x-rays including possible  fracture, which if missed can result in an appropriate treatment of a open fracture, significant long-term complications including, but not limited to, missed fracture, joint stiffness, missed foreign body, or loss of function.  He states his understanding and still refuses x-ray.  Laceration was repaired, please see procedure note.  Tdap was updated.  Patient will be given a short course of Keflex to prevent infection given the contaminated nature of the wound.  Wound was viewed in a blood free field and the base of the wound was visualized without evidence of retained debris or foreign body.  This patient was seen as a shared visit with Dr. Reather Converse.   Return precautions were discussed with patient who states their understanding.  At the time of discharge patient denied any unaddressed complaints or concerns.  Patient is agreeable for discharge home.   Final Clinical Impressions(s) / ED Diagnoses   Final diagnoses:  Laceration of right middle finger without foreign body without damage to nail, initial encounter  Fall, initial encounter    ED Discharge Orders         Ordered    cephALEXin (KEFLEX) 500 MG capsule  4 times daily     04/14/19 1950           Ollen Gross 04/14/19 2254    Elnora Morrison, MD 04/14/19 [phone]

## 2019-04-14 NOTE — ED Notes (Signed)
Dr Reather Converse saw pt and said he did not have to have an xray.

## 2019-04-16 DIAGNOSIS — Z9889 Other specified postprocedural states: Secondary | ICD-10-CM | POA: Diagnosis not present

## 2019-04-16 DIAGNOSIS — Z923 Personal history of irradiation: Secondary | ICD-10-CM | POA: Diagnosis not present

## 2019-04-16 DIAGNOSIS — D333 Benign neoplasm of cranial nerves: Secondary | ICD-10-CM | POA: Diagnosis not present

## 2019-08-02 ENCOUNTER — Ambulatory Visit: Payer: Medicare Other | Attending: Family

## 2019-08-02 DIAGNOSIS — Z23 Encounter for immunization: Secondary | ICD-10-CM

## 2019-08-02 NOTE — Progress Notes (Signed)
   Covid-19 Vaccination Clinic  Name:  Walter Parrish    MRN: [id number] DOB: [birth year]  08/02/2019  [name] was observed post Covid-19 immunization for 15 minutes without incident. He was provided with Vaccine Information Sheet and instruction to access the V-Safe system.   [name] was instructed to call 911 with any severe reactions post vaccine: Marland Kitchen Difficulty breathing  . Swelling of face and throat  . A fast heartbeat  . A bad rash all over body  . Dizziness and weakness   Immunizations Administered    Name Date Dose VIS Date Route   Moderna COVID-19 Vaccine 08/02/2019  2:48 PM 0.5 mL 04/24/2019 Intramuscular   Manufacturer: Moderna   Lot: [id number]   [id number]

## 2019-09-04 ENCOUNTER — Ambulatory Visit: Payer: Medicare Other | Attending: Family

## 2019-09-04 DIAGNOSIS — Z23 Encounter for immunization: Secondary | ICD-10-CM

## 2019-09-04 NOTE — Progress Notes (Signed)
   Covid-19 Vaccination Clinic  Name:  Dvonta Caltrider    MRN: [id number] DOB: Nov 13, 1940  09/04/2019  Mr. Sutherland was observed post Covid-19 immunization for 15 minutes without incident. He was provided with Vaccine Information Sheet and instruction to access the V-Safe system.   Mr. Villena was instructed to call 911 with any severe reactions post vaccine: Marland Kitchen Difficulty breathing  . Swelling of face and throat  . A fast heartbeat  . A bad rash all over body  . Dizziness and weakness   Immunizations Administered    Name Date Dose VIS Date Route   Moderna COVID-19 Vaccine 09/04/2019  2:53 PM 0.5 mL 04/24/2019 Intramuscular   Manufacturer: Moderna   Lot: [id number]   [id number]

## 2020-10-14 DIAGNOSIS — L237 Allergic contact dermatitis due to plants, except food: Secondary | ICD-10-CM | POA: Diagnosis not present

## 2021-04-09 DIAGNOSIS — G510 Bell's palsy: Secondary | ICD-10-CM | POA: Diagnosis not present

## 2021-04-09 DIAGNOSIS — Z923 Personal history of irradiation: Secondary | ICD-10-CM | POA: Diagnosis not present

## 2021-04-09 DIAGNOSIS — D333 Benign neoplasm of cranial nerves: Secondary | ICD-10-CM | POA: Diagnosis not present

## 2021-04-29 DIAGNOSIS — U071 COVID-19: Secondary | ICD-10-CM | POA: Diagnosis not present

## 2021-04-29 DIAGNOSIS — R051 Acute cough: Secondary | ICD-10-CM | POA: Diagnosis not present

## 2022-01-17 DIAGNOSIS — L237 Allergic contact dermatitis due to plants, except food: Secondary | ICD-10-CM | POA: Diagnosis not present

## 2022-01-17 DIAGNOSIS — T7840XA Allergy, unspecified, initial encounter: Secondary | ICD-10-CM | POA: Diagnosis not present

## 2022-01-17 DIAGNOSIS — R21 Rash and other nonspecific skin eruption: Secondary | ICD-10-CM | POA: Diagnosis not present

## 2022-01-17 DIAGNOSIS — L509 Urticaria, unspecified: Secondary | ICD-10-CM | POA: Diagnosis not present

## 2022-03-27 ENCOUNTER — Encounter (HOSPITAL_COMMUNITY): Payer: Self-pay

## 2022-03-27 ENCOUNTER — Ambulatory Visit (HOSPITAL_COMMUNITY)
Admission: EM | Admit: 2022-03-27 | Discharge: 2022-03-27 | Disposition: A | Discharge status: Home / Self Care | Payer: Medicare HMO | Attending: Physician Assistant | Admitting: Physician Assistant

## 2022-03-27 DIAGNOSIS — S50812A Abrasion of left forearm, initial encounter: Secondary | ICD-10-CM

## 2022-03-27 DIAGNOSIS — S80212A Abrasion, left knee, initial encounter: Secondary | ICD-10-CM

## 2022-03-27 DIAGNOSIS — S80211A Abrasion, right knee, initial encounter: Secondary | ICD-10-CM | POA: Diagnosis not present

## 2022-03-27 DIAGNOSIS — S0181XA Laceration without foreign body of other part of head, initial encounter: Secondary | ICD-10-CM | POA: Diagnosis not present

## 2022-03-27 DIAGNOSIS — W19XXXA Unspecified fall, initial encounter: Secondary | ICD-10-CM

## 2022-03-27 DIAGNOSIS — T148XXA Other injury of unspecified body region, initial encounter: Secondary | ICD-10-CM

## 2022-03-27 MED ORDER — MUPIROCIN 2 % EX OINT: 1.0000 | TOPICAL_OINTMENT | Freq: Two times a day (BID) | CUTANEOUS | 2 refills | Status: DC

## 2022-03-27 NOTE — ED Triage Notes (Signed)
Pt fell today and injured chin, scraped both right and left leg, left elbow today.

## 2022-03-27 NOTE — Discharge Instructions (Signed)
We placed 3 sutures in your chin.  Please keep this area clean.  If you have any swelling, drainage, pain you need to be seen immediately to consider antibiotics.  We have cleaned your abrasions.  Keep them clean with soap and water and apply ointment twice daily.  If he is have any signs of infections need to be seen immediately.  If you develop any difficulty eating, chewing, swelling of your jaw, trouble walking, weakness, headache, dizziness, nausea, vomiting you need to go to the emergency room immediately.

## 2022-03-27 NOTE — ED Provider Notes (Addendum)
[place]    CSN: [id number] Arrival date & time: 03/27/22  1705      History   Chief Complaint Chief Complaint  Patient presents with   Fall    HPI Walter Parrish is a 81 y.o. male.   Patient presents today following a fall that occurred a few hours ago.  Reports [place] he tripped over a branch causing him to fall forward onto his knees and hit his chin.  He did clean the area with iodine and rinse this well.  He is confident that he not hit his head and denies any headache, dizziness, loss of consciousness, nausea, vomiting, amnesia surrounding event.  He does not take any blood thinning medications.  He is comfort that is tetanus is up-to-date as he fell in a similar episode several years ago and had it updated at that point (04/14/2019).  He denies any difficulty ambulating, knee pain, numbness or tingling in his legs.  Reports that his primary concern today is that the wounds and he believes that he might need stitches on the laceration under his chin.  He is able to open his mouth without difficulty.    Past Medical History:  Diagnosis Date   Benign brain tumor [facility])    Radation Dec 2019    There are no problems to display for this patient.   History reviewed. No pertinent surgical history.     Home Medications    Prior to Admission medications   Medication Sig Start Date End Date Taking? Authorizing Provider  mupirocin ointment (BACTROBAN) 2 % Apply 1 Application topically 2 (two) times daily. 03/27/22  Yes Treva Huyett, Derry Skill, PA-C  artificial tears (LACRILUBE) OINT ophthalmic ointment Place into the right eye every 4 (four) hours as needed for dry eyes. 10/18/17   Cardama, Grayce Sessions, MD  cephALEXin (KEFLEX) 500 MG capsule Take 1 capsule (500 mg total) by mouth 4 (four) times daily. 04/14/19   Lorin Glass, PA-C  Cyanocobalamin (VITAMIN B 12 PO) Take 1 tablet by mouth daily.    [provider]  meclizine  (ANTIVERT) 25 MG tablet Take 25 mg by mouth daily.    [provider]  methylPREDNISolone (MEDROL DOSEPAK) 4 MG TBPK tablet Use as directed on the package 10/18/17   Cardama, Grayce Sessions, MD    Family History History reviewed. No pertinent family history.  Social History Social History   Tobacco Use   Smoking status: Never   Smokeless tobacco: Never  Vaping Use   Vaping Use: Never used  Substance Use Topics   Alcohol use: Yes    Alcohol/week: 1.0 standard drink of alcohol    Types: 1 Glasses of wine per week    Comment: daily   Drug use: Never     Allergies   Patient has no known allergies.   Review of Systems Review of Systems  Constitutional:  Negative for activity change, appetite change, fatigue and fever.  Eyes:  Negative for visual disturbance.  Respiratory:  Negative for cough and shortness of breath.   Cardiovascular:  Negative for chest pain.  Gastrointestinal:  Negative for abdominal pain, diarrhea, nausea and vomiting.  Musculoskeletal:  Negative for arthralgias and myalgias.  Skin:  Positive for wound. Negative for color change.  Neurological:  Negative for dizziness, light-headedness and headaches.     Physical Exam Triage Vital Signs ED Triage Vitals  Enc Vitals Group     BP 03/27/22 1719 125/76  Pulse Rate 03/27/22 1719 77     Resp 03/27/22 1719 16     Temp 03/27/22 1719 99.1 F (37.3 C)     Temp Source 03/27/22 1719 Oral     SpO2 03/27/22 1719 98 %     Weight --      Height --      Head Circumference --      Peak Flow --      Pain Score 03/27/22 1718 0     Pain Loc --      Pain Edu? --      Excl. in [place]? --    No data found.  Updated Vital Signs BP 125/76 (BP Location: Right Arm)   Pulse 77   Temp 99.1 F (37.3 C) (Oral)   Resp 16   SpO2 98%   Visual Acuity Right Eye Distance:   Left Eye Distance:   Bilateral Distance:    Right Eye Near:   Left Eye Near:    Bilateral Near:     Physical Exam Vitals reviewed.   Constitutional:      General: He is awake.     Appearance: Normal appearance. He is well-developed. He is not ill-appearing.     Comments: Very pleasant male appears stated age in no acute distress sitting comfortably in exam room  HENT:     Head: Normocephalic. Laceration present.     Jaw: There is normal jaw occlusion. No tenderness, swelling, pain on movement or malocclusion.     Comments: 3 cm laceration noted inferior mental protuberance with surrounding ecchymosis.  No tenderness over jaw.  No deformity noted.    Right Ear: Tympanic membrane, ear canal and external ear normal. No hemotympanum.     Left Ear: Tympanic membrane, ear canal and external ear normal. No hemotympanum.     Nose: Nose normal.     Mouth/Throat:     Pharynx: Uvula midline. No oropharyngeal exudate or posterior oropharyngeal erythema.  Eyes:     Extraocular Movements: Extraocular movements intact.     Conjunctiva/sclera: Conjunctivae normal.     Pupils: Pupils are equal, round, and reactive to light.  Cardiovascular:     Rate and Rhythm: Normal rate and regular rhythm.     Heart sounds: Normal heart sounds, S1 normal and S2 normal. No murmur heard. Pulmonary:     Effort: Pulmonary effort is normal. No accessory muscle usage or respiratory distress.     Breath sounds: Normal breath sounds. No stridor. No wheezing, rhonchi or rales.     Comments: Clear to auscultation bilaterally Musculoskeletal:     Cervical back: Normal range of motion and neck supple.  Skin:    Findings: Abrasion and laceration present.     Comments: Multiple abrasions noted anterior knees bilaterally and left forearm without active bleeding.  3 cm laceration noted inferior mental protuberance.  Neurological:     General: No focal deficit present.     Mental Status: He is alert and oriented to person, place, and time.     Cranial Nerves: Cranial nerves 2-12 are intact.     Motor: Motor function is intact.     Coordination: Coordination  is intact.     Gait: Gait is intact.     Comments: Cranial nerves II through XII grossly intact.  Psychiatric:        Behavior: Behavior is cooperative.      UC Treatments / Results  Labs (all labs ordered are listed, but only abnormal results are displayed)  Labs Reviewed - No data to display  EKG   Radiology No results found.  Procedures Laceration Repair  Date/Time: 03/27/2022 6:32 PM  Performed by: Terrilee Croak, PA-C Authorized by: Terrilee Croak, PA-C   Consent:    Consent obtained:  Verbal   Consent given by:  Patient   Risks, benefits, and alternatives were discussed: yes     Risks discussed:  Infection, poor cosmetic result and poor wound healing   Alternatives discussed:  Referral and observation Universal protocol:    Procedure explained and questions answered to patient or proxy's satisfaction: yes     Patient identity confirmed:  Verbally with patient Anesthesia:    Anesthesia method:  Local infiltration   Local anesthetic:  Lidocaine 1% WITH epi Laceration details:    Location:  Face   Face location:  Chin   Length (cm):  3   Depth (mm):  5 Pre-procedure details:    Preparation:  Patient was prepped and draped in usual sterile fashion Exploration:    Limited defect created (wound extended): no     Hemostasis achieved with:  Direct pressure and epinephrine   Wound exploration: entire depth of wound visualized     Contaminated: no   Treatment:    Area cleansed with:  Chlorhexidine   Amount of cleaning:  Standard   Irrigation solution:  Sterile water   Irrigation volume:  20 mL   Irrigation method:  Syringe   Visualized foreign bodies/material removed: no     Debridement:  None   Undermining:  None   Scar revision: no   Skin repair:    Repair method:  Sutures   Suture size:  5-0   Suture material:  Prolene   Suture technique:  Simple interrupted   Number of sutures:  3 Approximation:    Approximation:  Close Repair type:    Repair type:   Simple Post-procedure details:    Dressing:  Open (no dressing)   Procedure completion:  Tolerated  (including critical care time)  Medications Ordered in UC Medications - No data to display  Initial Impression / Assessment and Plan / UC Course  I have reviewed the triage vital signs and the nursing notes.  Pertinent labs & imaging results that were available during my care of the patient were reviewed by me and considered in my medical decision making (see chart for details).     Patient is well-appearing with reassuring exam today.  He is confident that he did not hit his head and denies any concerning symptoms.  Discussed that we do not have advanced imaging in clinic if he develops any worsening symptoms he would need to go to the emergency room to which he expressed understanding.  Tetanus is up-to-date.  3 sutures placed in chin laceration which patient tolerated well.  See procedure note above.  Wounds were cleaned with chlorhexidine and wound cleaner then irrigated with sterile water in clinic prior to being redressed during visit.  Discussed that he will need to keep area clean with soap and water and Bactroban ointment was provided to prevent infection.  Encouraged him to follow-up closely with his primary care.  He will return in 7 to 10 days for suture removal.  Discussed signs/symptoms of infection that would warrant reevaluation to consider antibiotics.  Discussed alarm symptoms that warrant emergent evaluation.  Strict return precautions given.  Final Clinical Impressions(s) / UC Diagnoses   Final diagnoses:  Chin laceration, initial encounter  Skin abrasion  Fall,  initial encounter     Discharge Instructions      We placed 3 sutures in your chin.  Please keep this area clean.  If you have any swelling, drainage, pain you need to be seen immediately to consider antibiotics.  We have cleaned your abrasions.  Keep them clean with soap and water and apply ointment twice  daily.  If he is have any signs of infections need to be seen immediately.  If you develop any difficulty eating, chewing, swelling of your jaw, trouble walking, weakness, headache, dizziness, nausea, vomiting you need to go to the emergency room immediately.    ED Prescriptions     Medication Sig Dispense Auth. Provider   mupirocin ointment (BACTROBAN) 2 % Apply 1 Application topically 2 (two) times daily. 22 g Moustafa Mossa K, PA-C      PDMP not reviewed this encounter.   Terrilee Croak, PA-C 03/27/22 1832    Huzaifa Viney, Derry Skill, PA-C 03/27/22 1834

## 2022-04-05 ENCOUNTER — Ambulatory Visit (HOSPITAL_COMMUNITY)
Admission: RE | Admit: 2022-04-05 | Discharge: 2022-04-05 | Disposition: A | Discharge status: Home / Self Care | Payer: Medicare HMO | Source: Ambulatory Visit

## 2022-04-05 DIAGNOSIS — S0181XD Laceration without foreign body of other part of head, subsequent encounter: Secondary | ICD-10-CM

## 2022-04-05 NOTE — ED Triage Notes (Signed)
Pt got 3 sutures to chin area that is needing removed. Denies s/s of infection and area healing well.

## 2022-07-30 DIAGNOSIS — S61412A Laceration without foreign body of left hand, initial encounter: Secondary | ICD-10-CM | POA: Diagnosis not present

## 2022-08-12 DIAGNOSIS — S61412D Laceration without foreign body of left hand, subsequent encounter: Secondary | ICD-10-CM | POA: Diagnosis not present

## 2022-08-12 DIAGNOSIS — Z4802 Encounter for removal of sutures: Secondary | ICD-10-CM | POA: Diagnosis not present

## 2023-02-28 DIAGNOSIS — R6889 Other general symptoms and signs: Secondary | ICD-10-CM | POA: Diagnosis not present

## 2023-02-28 DIAGNOSIS — Z136 Encounter for screening for cardiovascular disorders: Secondary | ICD-10-CM | POA: Diagnosis not present

## 2023-02-28 DIAGNOSIS — R6884 Jaw pain: Secondary | ICD-10-CM | POA: Diagnosis not present

## 2023-04-18 ENCOUNTER — Encounter: Payer: Self-pay | Admitting: Cardiovascular Disease

## 2023-04-18 ENCOUNTER — Ambulatory Visit: Payer: Medicare HMO | Attending: Cardiovascular Disease | Admitting: Cardiovascular Disease

## 2023-04-18 VITALS — BP 112/70 | HR 62 | Ht 66.0 in | Wt 145.0 lb

## 2023-04-18 DIAGNOSIS — R6889 Other general symptoms and signs: Secondary | ICD-10-CM | POA: Diagnosis not present

## 2023-04-18 DIAGNOSIS — Z86018 Personal history of other benign neoplasm: Secondary | ICD-10-CM

## 2023-04-18 DIAGNOSIS — E7800 Pure hypercholesterolemia, unspecified: Secondary | ICD-10-CM | POA: Diagnosis not present

## 2023-04-18 DIAGNOSIS — R0602 Shortness of breath: Secondary | ICD-10-CM

## 2023-04-18 DIAGNOSIS — I4510 Unspecified right bundle-branch block: Secondary | ICD-10-CM

## 2023-04-18 NOTE — Progress Notes (Signed)
Cardiology Office Note:    Date:  04/18/2023   ID:  Walter Parrish, DOB December 28, 1940, MRN [id number]  PCP:  Patient, No Pcp Per   [facility] Providers Cardiologist:  None     Referring MD: Laurann Montana, MD   Chief Complaint  Patient presents with   Consult  Walter Parrish is a 82 y.o. male who is being seen today for the evaluation of decreased exercise tolerance at the request of Laurann Montana, MD.   History of Present Illness:    The patient, a lifelong jogger, presents with a complaint of a sudden and significant decrease in stamina over the past five to six years. The patient reports a gradual slowing of their mile time from seven minutes to around eleven minutes over the years, which they attributed to aging. However, in their mid-seventies, they noticed a drastic drop in their running speed, now taking approximately thirteen minutes to complete a mile. The patient also reports that they can no longer run more than two miles due to shortness of breath, which they describe as the primary factor limiting their exercise capacity. The patient denies any chest pain, dizziness, or knee issues during exercise. The patient also has a history of an acoustic neuroma, which caused dizziness and balance issues, significantly affecting their mobility during the summer of 2020 (improved after gamma knife therapy). However, the patient reports that the decrease in stamina was already noticeable before the onset of these symptoms. In addition to these issues, the patient reports occasional jaw pain, which makes eating difficult at times. However, they do not believe this is related to their exercise capacity as it does not affect them during running. The patient has a history of high cholesterol, which they previously managed with a cholesterol reduction program. They report a diet primarily consisting of fish, chicken, and nuts, and they maintain a lean and fit physique. The patient's family  history is notable for a father who suffered from severe arthritis and passed away at the age of 35 due to complications from immobility. The patient's mother lived to 38, and their siblings, who are in their sixties and seventies, are reportedly healthy.   He is originally from [place].  Past Medical History:  Diagnosis Date   Benign brain tumor [facility])    Radation Dec 2019    No past surgical history on file.  Current Medications: No outpatient medications have been marked as taking for the 04/18/23 encounter (Office Visit) with Thurmon Fair, MD.     Allergies:   Patient has no known allergies.   Social History   Socioeconomic History   Marital status: Single    Spouse name: Not on file   Number of children: Not on file   Years of education: Not on file   Highest education level: Not on file  Occupational History   Not on file  Tobacco Use   Smoking status: Never   Smokeless tobacco: Never  Vaping Use   Vaping status: Never Used  Substance and Sexual Activity   Alcohol use: Yes    Alcohol/week: 1.0 standard drink of alcohol    Types: 1 Glasses of wine per week    Comment: daily   Drug use: Never   Sexual activity: Not on file  Other Topics Concern   Not on file  Social History Narrative   Not on file   Social Determinants of Health   Financial Resource Strain: Not on file  Food Insecurity: Not on file  Transportation Needs: Not on file  Physical Activity: Not on file  Stress: Not on file  Social Connections: Not on file     Family History: The patient's family history is significant for the absence of cardiac illness.  His mother lived to age 60.  His father died from complications of rheumatoid arthritis at age 57.  He is the third oldest out of 8 siblings.  His 2 older brothers and all his younger siblings are in good health.  ROS:   Please see the history of present illness.     All other systems reviewed and are negative.  EKGs/Labs/Other Studies  Reviewed:    The following studies were reviewed today:  EKG Interpretation Date/Time:  Monday April 18 2023 14:58:56 EST Ventricular Rate:  62 PR Interval:  196 QRS Duration:  114 QT Interval:  446 QTC Calculation: 452 R Axis:   -37  Text Interpretation: Normal sinus rhythm Left axis deviation Incomplete right bundle branch block When compared with ECG of 18-Oct-2017 10:52,  axis has shifted left Confirmed by Dennard Vezina [phone]) on 04/18/2023 3:16:11 PM    Recent Labs: No results found for requested labs within last 365 days.  01/29/2023 hemoglobin 14.5, potassium 4.3, ALT 20, TSH 1.5. Recent Lipid Panel No results found for: "CHOL", "TRIG", "HDL", "CHOLHDL", "VLDL", "LDLCALC", "LDLDIRECT" 02/28/2023 cholesterol 243, HDL 68, LDL 163, triglycerides 70  Risk Assessment/Calculations:                Physical Exam:    VS:  BP 112/70 (BP Location: Left Arm, Patient Position: Sitting, Cuff Size: Normal)   Pulse 62   Ht 5\' 6"  (1.676 m)   Wt 145 lb (65.8 kg)   SpO2 98%   BMI 23.40 kg/m     Wt Readings from Last 3 Encounters:  04/18/23 145 lb (65.8 kg)  04/14/19 145 lb (65.8 kg)     GEN: Appears substantially younger than stated age, very lean and fit, well nourished, well developed in no acute distress HEENT: Normal NECK: No JVD; No carotid bruits LYMPHATICS: No lymphadenopathy CARDIAC: RRR, widely split second heart sound, no murmurs, rubs, gallops RESPIRATORY:  Clear to auscultation without rales, wheezing or rhonchi  ABDOMEN: Soft, non-tender, non-distended MUSCULOSKELETAL:  No edema; No deformity  SKIN: Warm and dry NEUROLOGIC:  Alert and oriented x 3 PSYCHIATRIC:  Normal affect   ASSESSMENT:    1. Decreased exercise tolerance   2. Shortness of breath   3. RBBB   4. Hypercholesterolemia    PLAN:    In order of problems listed above:  Decreased exercise tolerance/exertional dyspnea: Although he is much more physically active than the average  of-year-old, he has noticed a significant decline exercise tolerance, more than he expected from the previous gradual pattern with aging.  Will schedule him for a treadmill stress test and echocardiogram.  It is possible that his exertional dyspnea is an anginal equivalent.  If there is evidence of regional wall motion abnormality or decreased LVEF on echo or if there is abnormal ST segment depression during his treadmill stress test would recommend proceeding directly to invasive angiography.  Otherwise, if we cannot identify a cause for his exercise intolerance, we can consider a coronary CT angiogram. RBBB: Also with left axis deviation and almost meets criteria for left anterior fascicular block.  Has not had any symptoms to suggest high-grade AV block.  There is a possibility that he may have reduced exercise tolerance if he develops AV block at faster  heart rates.  Will pay attention to that during his treadmill stress test.  At this point there is no indication for pacemaker implantation. HLP: Today he does not have any known CAD or PAD or other complications of atherosclerosis.  His LDL cholesterol 163 is high enough that we should consider lipid-lowering medications.  He is already eating a healthy diet and exercising regularly.  He is very lean.  We can wait for the results of his cardiac workup, but if we identify evidence of coronary atherosclerosis would should for target LDL less than 70, if not, target LDL less than 100.       Informed Consent   Shared Decision Making/Informed Consent The risks [chest pain, shortness of breath, cardiac arrhythmias, dizziness, blood pressure fluctuations, myocardial infarction, stroke/transient ischemic attack, and life-threatening complications (estimated to be 1 in 10,000)], benefits (risk stratification, diagnosing coronary artery disease, treatment guidance) and alternatives of an exercise tolerance test were discussed in detail with Mr. Macgillivray and he agrees  to proceed.       Medication Adjustments/Labs and Tests Ordered: Current medicines are reviewed at length with the patient today.  Concerns regarding medicines are outlined above.  Orders Placed This Encounter  Procedures   Cardiac Stress Test: Informed Consent Details: Physician/Practitioner Attestation; Transcribe to consent form and obtain patient signature   EXERCISE TOLERANCE TEST (ETT)   EKG 12-Lead   ECHOCARDIOGRAM COMPLETE   No orders of the defined types were placed in this encounter.   Patient Instructions  Medication Instructions:  No changes *If you need a refill on your cardiac medications before your next appointment, please call your pharmacy*  Testing/Procedures: Your physician has requested that you have an echocardiogram. Echocardiography is a painless test that uses sound waves to create images of your heart. It provides your doctor with information about the size and shape of your heart and how well your heart's chambers and valves are working. This procedure takes approximately one hour. There are no restrictions for this procedure. Please do NOT wear cologne, perfume, aftershave, or lotions (deodorant is allowed). Please arrive 15 minutes prior to your appointment time.  Please note: We ask at that you not bring children with you during ultrasound (echo/ vascular) testing. Due to room size and safety concerns, children are not allowed in the ultrasound rooms during exams. Our front office staff cannot provide observation of children in our lobby area while testing is being conducted. An adult accompanying a patient to their appointment will only be allowed in the ultrasound room at the discretion of the ultrasound technician under special circumstances. We apologize for any inconvenience.                         Patient Instructions for Stress Test/Exercise Treadmill Test (ETT)  Medication instructions:   You may take your regular medications  Do not eat, drink  or use tobacco products four hours prior to the test.  Water is ok.  3.  Dress prepared to exercise in a comfortable, two piece clothing outfit and walking shoes.  4.  Bring any current prescription medications with you the day of the test.  5.  Notify the office 24 hours in advance if you cannot keep this appointment.  6.  If you have any questions, please call [phone].    Follow-Up: At [facility], you and your health needs are our priority.  As part of our continuing mission to provide you with exceptional heart  care, we have created designated Provider Care Teams.  These Care Teams include your primary Cardiologist (physician) and Advanced Practice Providers (APPs -  Physician Assistants and Nurse Practitioners) who all work together to provide you with the care you need, when you need it.  We recommend signing up for the patient portal called "MyChart".  Sign up information is provided on this After Visit Summary.  MyChart is used to connect with patients for Virtual Visits (Telemedicine).  Patients are able to view lab/test results, encounter notes, upcoming appointments, etc.  Non-urgent messages can be sent to your provider as well.   To learn more about what you can do with MyChart, go to [email or web address].    Your next appointment:    4-5 months  Provider:   Dr Royann Shivers    Signed, Thurmon Fair, MD  04/18/2023 4:42 PM    [facility]

## 2023-04-18 NOTE — Patient Instructions (Signed)
Medication Instructions:  No changes *If you need a refill on your cardiac medications before your next appointment, please call your pharmacy*  Testing/Procedures: Your physician has requested that you have an echocardiogram. Echocardiography is a painless test that uses sound waves to create images of your heart. It provides your doctor with information about the size and shape of your heart and how well your heart's chambers and valves are working. This procedure takes approximately one hour. There are no restrictions for this procedure. Please do NOT wear cologne, perfume, aftershave, or lotions (deodorant is allowed). Please arrive 15 minutes prior to your appointment time.  Please note: We ask at that you not bring children with you during ultrasound (echo/ vascular) testing. Due to room size and safety concerns, children are not allowed in the ultrasound rooms during exams. Our front office staff cannot provide observation of children in our lobby area while testing is being conducted. An adult accompanying a patient to their appointment will only be allowed in the ultrasound room at the discretion of the ultrasound technician under special circumstances. We apologize for any inconvenience.                         Patient Instructions for Stress Test/Exercise Treadmill Test (ETT)  Medication instructions:   You may take your regular medications  Do not eat, drink or use tobacco products four hours prior to the test.  Water is ok.  3.  Dress prepared to exercise in a comfortable, two piece clothing outfit and walking shoes.  4.  Bring any current prescription medications with you the day of the test.  5.  Notify the office 24 hours in advance if you cannot keep this appointment.  6.  If you have any questions, please call [phone].    Follow-Up: At [facility], you and your health needs are our priority.  As part of our continuing mission to provide you with exceptional  heart care, we have created designated Provider Care Teams.  These Care Teams include your primary Cardiologist (physician) and Advanced Practice Providers (APPs -  Physician Assistants and Nurse Practitioners) who all work together to provide you with the care you need, when you need it.  We recommend signing up for the patient portal called "MyChart".  Sign up information is provided on this After Visit Summary.  MyChart is used to connect with patients for Virtual Visits (Telemedicine).  Patients are able to view lab/test results, encounter notes, upcoming appointments, etc.  Non-urgent messages can be sent to your provider as well.   To learn more about what you can do with MyChart, go to [email or web address].    Your next appointment:    4-5 months  Provider:   Dr Royann Shivers

## 2023-06-07 ENCOUNTER — Ambulatory Visit (HOSPITAL_BASED_OUTPATIENT_CLINIC_OR_DEPARTMENT_OTHER): Payer: Medicare HMO

## 2023-06-07 ENCOUNTER — Ambulatory Visit: Payer: Medicare HMO | Attending: Cardiovascular Disease

## 2023-06-07 DIAGNOSIS — R0602 Shortness of breath: Secondary | ICD-10-CM

## 2023-06-07 LAB — EXERCISE TOLERANCE TEST
Angina Index: 0
Estimated workload: 10.9
Exercise duration (min): 9 min
Exercise duration (sec): 31 s
MPHR: 140 {beats}/min
Peak HR: 131 {beats}/min
Percent HR: 93 %
RPE: 17
Rest HR: 61 {beats}/min

## 2023-06-07 LAB — ECHOCARDIOGRAM COMPLETE: S' Lateral: 2.47 cm

## 2023-06-22 DIAGNOSIS — Z0000 Encounter for general adult medical examination without abnormal findings: Secondary | ICD-10-CM | POA: Diagnosis not present

## 2023-06-22 DIAGNOSIS — Z800 Family history of malignant neoplasm of digestive organs: Secondary | ICD-10-CM | POA: Diagnosis not present

## 2023-06-22 DIAGNOSIS — N400 Enlarged prostate without lower urinary tract symptoms: Secondary | ICD-10-CM | POA: Diagnosis not present

## 2023-06-22 DIAGNOSIS — R0609 Other forms of dyspnea: Secondary | ICD-10-CM | POA: Diagnosis not present

## 2023-06-22 DIAGNOSIS — M8588 Other specified disorders of bone density and structure, other site: Secondary | ICD-10-CM | POA: Diagnosis not present

## 2023-06-22 DIAGNOSIS — Z8709 Personal history of other diseases of the respiratory system: Secondary | ICD-10-CM | POA: Diagnosis not present

## 2023-06-22 DIAGNOSIS — E785 Hyperlipidemia, unspecified: Secondary | ICD-10-CM | POA: Diagnosis not present

## 2023-07-01 ENCOUNTER — Other Ambulatory Visit: Payer: Self-pay | Admitting: Family Medicine

## 2023-07-01 DIAGNOSIS — M8580 Other specified disorders of bone density and structure, unspecified site: Secondary | ICD-10-CM

## 2023-07-08 ENCOUNTER — Ambulatory Visit
Admission: RE | Admit: 2023-07-08 | Discharge: 2023-07-08 | Disposition: A | Discharge status: Home / Self Care | Payer: Medicare HMO | Source: Ambulatory Visit | Attending: Family Medicine | Admitting: Family Medicine

## 2023-07-08 DIAGNOSIS — M8588 Other specified disorders of bone density and structure, other site: Secondary | ICD-10-CM | POA: Diagnosis not present

## 2023-07-08 DIAGNOSIS — R29890 Loss of height: Secondary | ICD-10-CM | POA: Diagnosis not present

## 2023-07-08 DIAGNOSIS — M8580 Other specified disorders of bone density and structure, unspecified site: Secondary | ICD-10-CM

## 2023-08-01 DIAGNOSIS — R350 Frequency of micturition: Secondary | ICD-10-CM | POA: Diagnosis not present

## 2023-08-01 DIAGNOSIS — R3912 Poor urinary stream: Secondary | ICD-10-CM | POA: Diagnosis not present

## 2023-08-01 DIAGNOSIS — R3914 Feeling of incomplete bladder emptying: Secondary | ICD-10-CM | POA: Diagnosis not present

## 2023-08-01 DIAGNOSIS — N401 Enlarged prostate with lower urinary tract symptoms: Secondary | ICD-10-CM | POA: Diagnosis not present

## 2023-09-05 DIAGNOSIS — R0609 Other forms of dyspnea: Secondary | ICD-10-CM | POA: Diagnosis not present

## 2023-11-21 DIAGNOSIS — N400 Enlarged prostate without lower urinary tract symptoms: Secondary | ICD-10-CM | POA: Diagnosis not present

## 2023-11-21 DIAGNOSIS — E785 Hyperlipidemia, unspecified: Secondary | ICD-10-CM | POA: Diagnosis not present

## 2023-12-22 DIAGNOSIS — E785 Hyperlipidemia, unspecified: Secondary | ICD-10-CM | POA: Diagnosis not present

## 2023-12-22 DIAGNOSIS — N400 Enlarged prostate without lower urinary tract symptoms: Secondary | ICD-10-CM | POA: Diagnosis not present

## 2024-01-22 DIAGNOSIS — E785 Hyperlipidemia, unspecified: Secondary | ICD-10-CM | POA: Diagnosis not present

## 2024-01-22 DIAGNOSIS — N400 Enlarged prostate without lower urinary tract symptoms: Secondary | ICD-10-CM | POA: Diagnosis not present

## 2024-02-21 DIAGNOSIS — N400 Enlarged prostate without lower urinary tract symptoms: Secondary | ICD-10-CM | POA: Diagnosis not present

## 2024-02-21 DIAGNOSIS — E785 Hyperlipidemia, unspecified: Secondary | ICD-10-CM | POA: Diagnosis not present

## 2024-03-23 DIAGNOSIS — E785 Hyperlipidemia, unspecified: Secondary | ICD-10-CM | POA: Diagnosis not present

## 2024-03-23 DIAGNOSIS — N400 Enlarged prostate without lower urinary tract symptoms: Secondary | ICD-10-CM | POA: Diagnosis not present

## 2024-04-22 DIAGNOSIS — N400 Enlarged prostate without lower urinary tract symptoms: Secondary | ICD-10-CM | POA: Diagnosis not present

## 2024-04-22 DIAGNOSIS — E785 Hyperlipidemia, unspecified: Secondary | ICD-10-CM | POA: Diagnosis not present
